# Patient Record
Sex: Female | Born: 1945 | Race: White | Hispanic: No | Marital: Married | Smoking: Former smoker
Health system: Southern US, Community
[De-identification: ages and names within clinical notes are randomized; demographics above are authoritative.]

## PROBLEM LIST (undated history)

## (undated) DIAGNOSIS — F329 Major depressive disorder, single episode, unspecified: Secondary | ICD-10-CM

## (undated) DIAGNOSIS — M810 Age-related osteoporosis without current pathological fracture: Secondary | ICD-10-CM

## (undated) DIAGNOSIS — F32A Depression, unspecified: Secondary | ICD-10-CM

## (undated) DIAGNOSIS — Z8701 Personal history of pneumonia (recurrent): Secondary | ICD-10-CM

## (undated) DIAGNOSIS — F419 Anxiety disorder, unspecified: Secondary | ICD-10-CM

## (undated) DIAGNOSIS — E785 Hyperlipidemia, unspecified: Secondary | ICD-10-CM

## (undated) DIAGNOSIS — I10 Essential (primary) hypertension: Secondary | ICD-10-CM

## (undated) HISTORY — PX: BACK SURGERY: SHX140

## (undated) HISTORY — PX: TOTAL HIP ARTHROPLASTY: SHX124

## (undated) HISTORY — PX: KNEE SURGERY: SHX244

---

## 2004-05-22 ENCOUNTER — Observation Stay (HOSPITAL_COMMUNITY): Admission: RE | Admit: 2004-05-22 | Discharge: 2004-05-23 | Payer: Self-pay | Admitting: Urology

## 2004-07-05 ENCOUNTER — Observation Stay (HOSPITAL_COMMUNITY): Admission: RE | Admit: 2004-07-05 | Discharge: 2004-07-06 | Payer: Self-pay | Admitting: Urology

## 2006-08-26 ENCOUNTER — Encounter: Admission: RE | Admit: 2006-08-26 | Discharge: 2006-08-26 | Payer: Self-pay | Admitting: Orthopedic Surgery

## 2006-09-20 ENCOUNTER — Inpatient Hospital Stay (HOSPITAL_COMMUNITY): Admission: RE | Admit: 2006-09-20 | Discharge: 2006-09-21 | Payer: Self-pay | Admitting: Orthopedic Surgery

## 2006-10-22 ENCOUNTER — Ambulatory Visit: Payer: Self-pay | Admitting: Vascular Surgery

## 2006-10-22 ENCOUNTER — Ambulatory Visit: Admission: RE | Admit: 2006-10-22 | Discharge: 2006-10-22 | Payer: Self-pay | Admitting: Orthopedic Surgery

## 2007-05-01 ENCOUNTER — Ambulatory Visit: Payer: Self-pay | Admitting: Vascular Surgery

## 2007-05-06 ENCOUNTER — Inpatient Hospital Stay (HOSPITAL_COMMUNITY): Admission: RE | Admit: 2007-05-06 | Discharge: 2007-05-10 | Payer: Self-pay | Admitting: Orthopedic Surgery

## 2007-06-01 IMAGING — CR DG CHEST 2V
2 series · 2 of 2 positions shown · non-contrast
Comparison: 05/19/04.

CLINICAL DATA: Preop for spinal stenosis.
 CHEST - 2 VIEW:

[w chest pa]
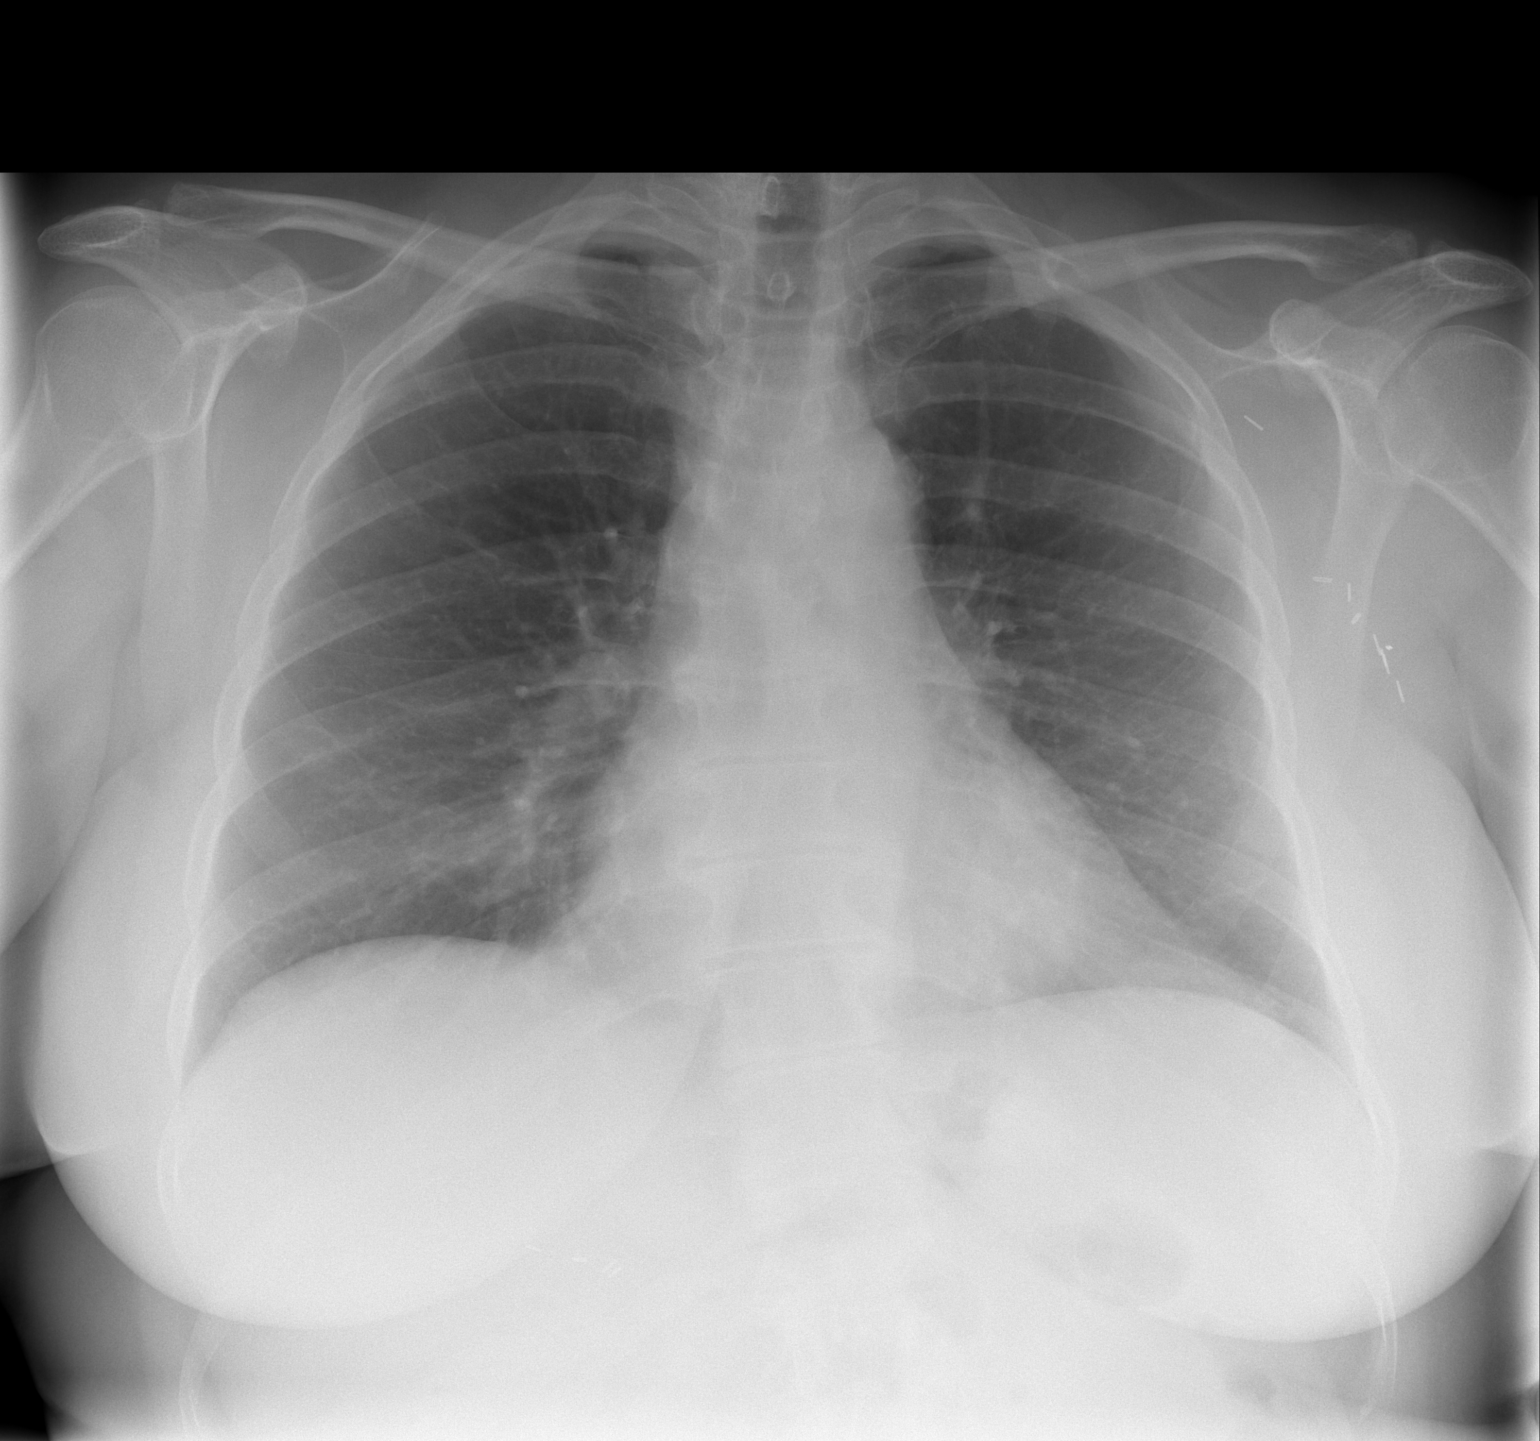

[w chest lat]
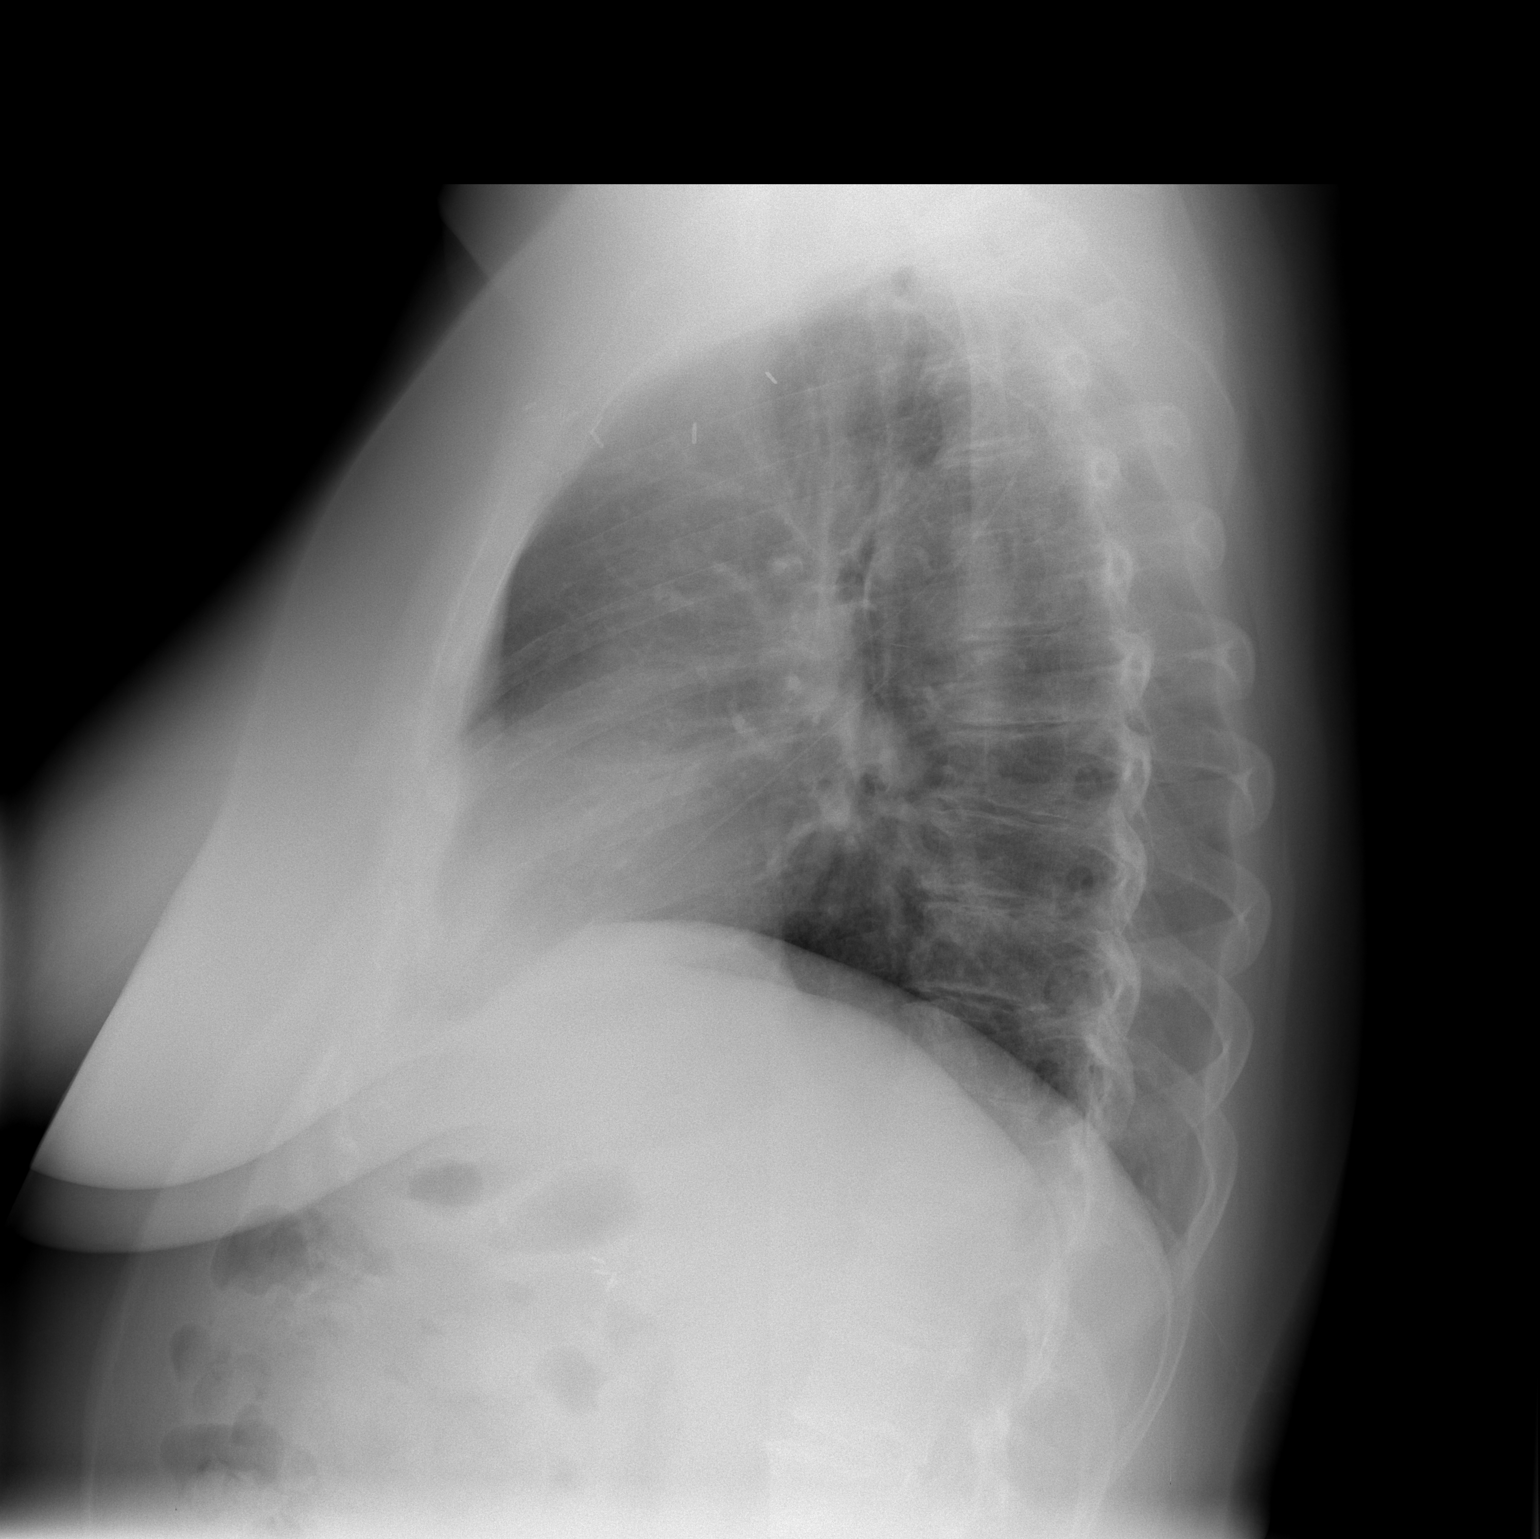

[2 of 2 positions shown; findings below may reference images not displayed]

FINDINGS: Heart and lungs normal.  There are surgical clips in the left axilla with surgical resection of the second rib.  No active process or bony lesions.
IMPRESSION: Postoperative changes as above ? no active disease.

## 2007-12-02 ENCOUNTER — Encounter: Admission: RE | Admit: 2007-12-02 | Discharge: 2007-12-02 | Payer: Self-pay | Admitting: Orthopedic Surgery

## 2008-08-13 IMAGING — US US EXTREM LOW VENOUS*L*
1 series · 14 of 24 positions shown · non-contrast
Comparison: None.

CLINICAL DATA: Left leg pain and swelling



[Series 1: us extrem low venous*left* · 14 of 29 slices shown]
[im 1/29]
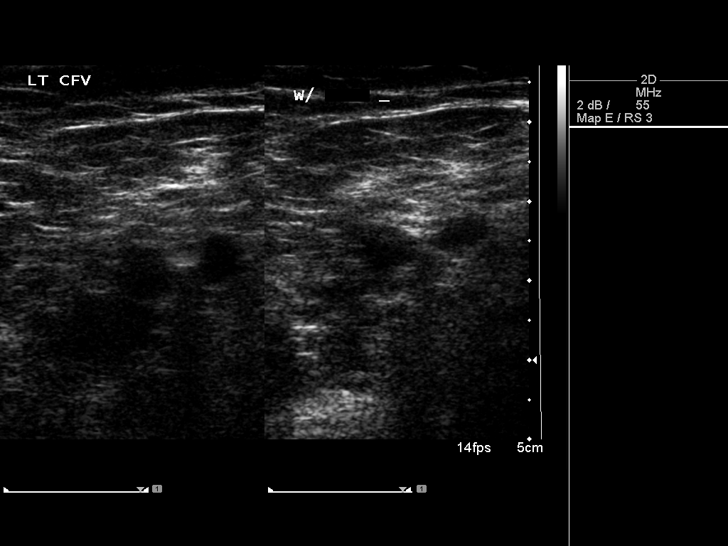
[im 3/29]
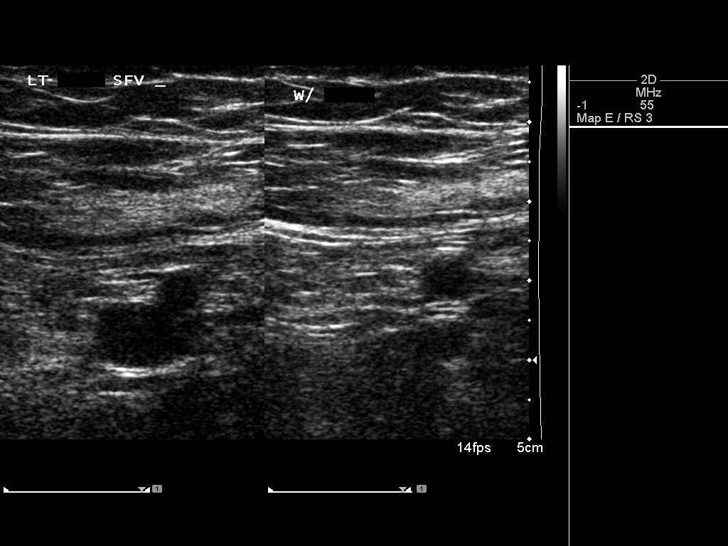
[im 5/29]
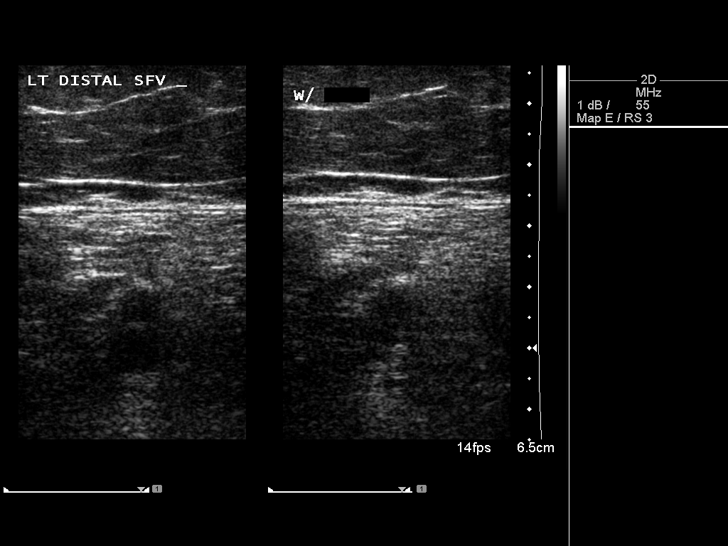
[im 8/29]
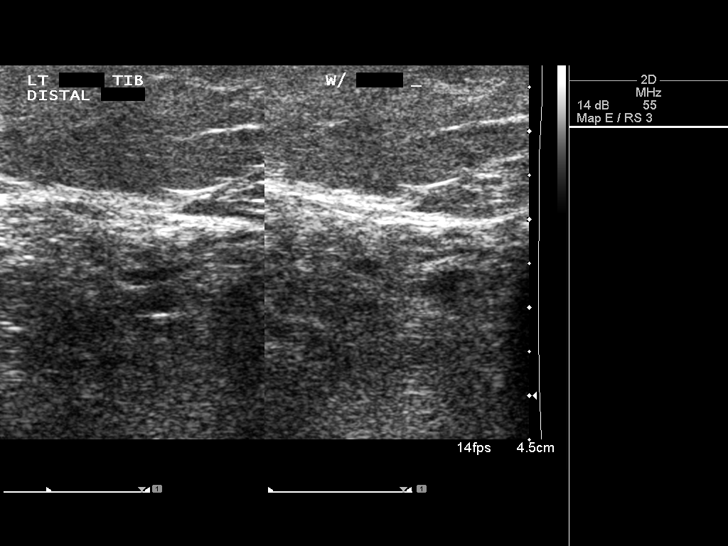
[im 9/29]
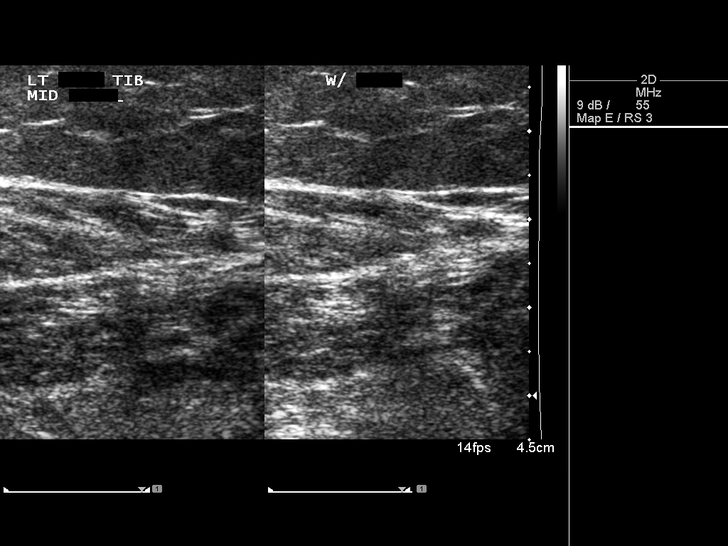
[im 11/29]
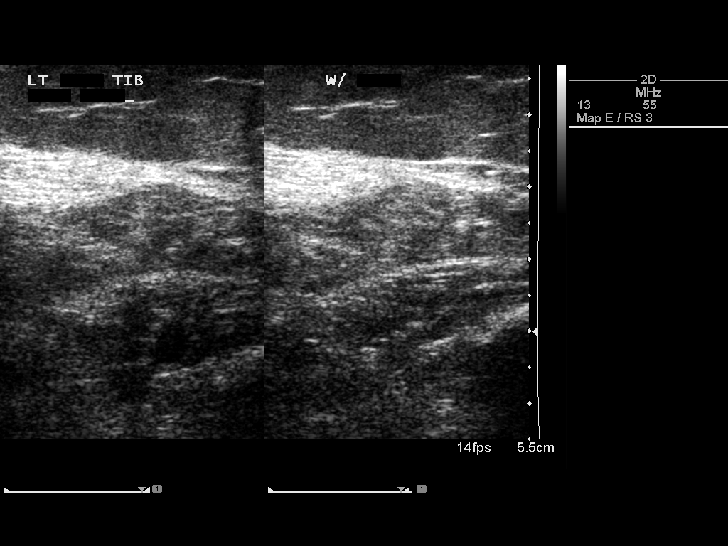
[im 14/29]
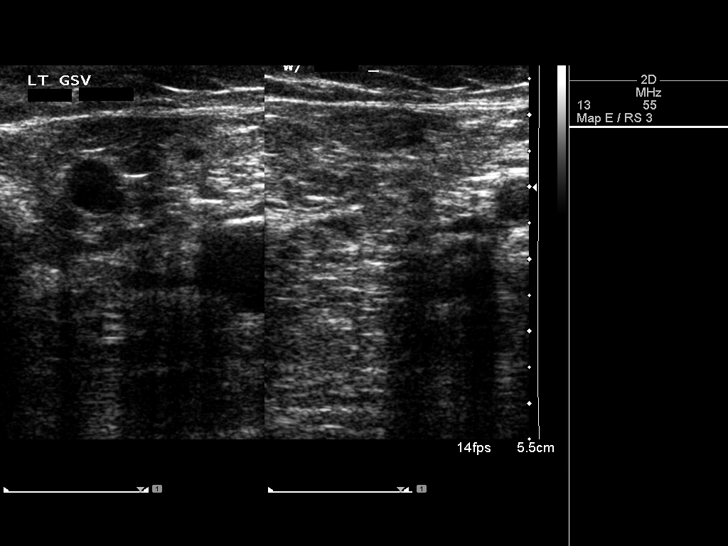
[im 15/29]
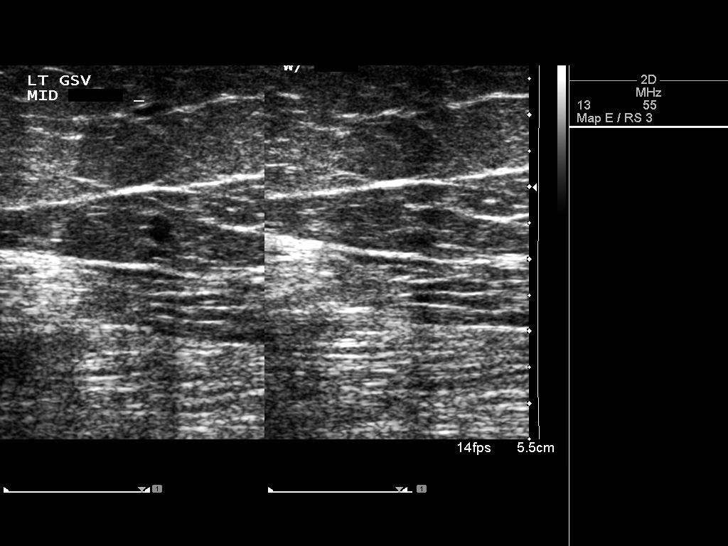
[im 18/29]
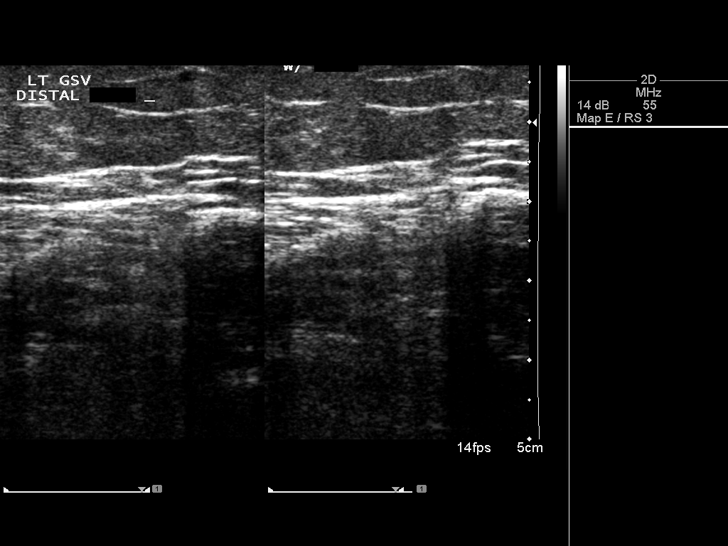
[im 20/29]
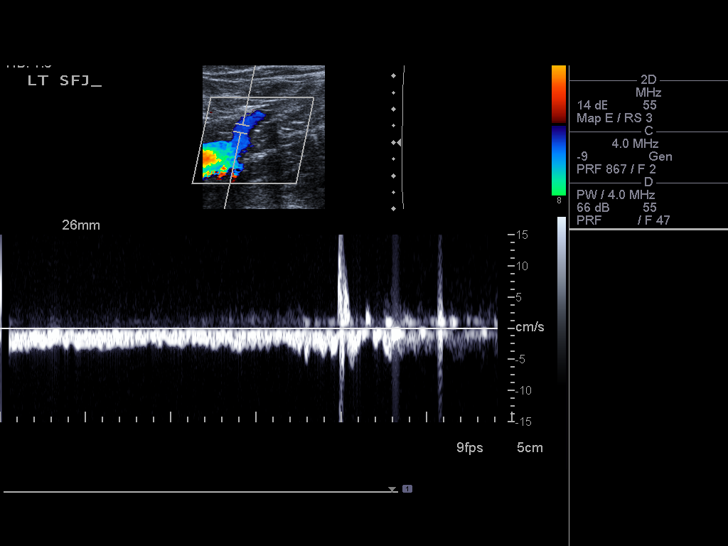
[im 22/29]
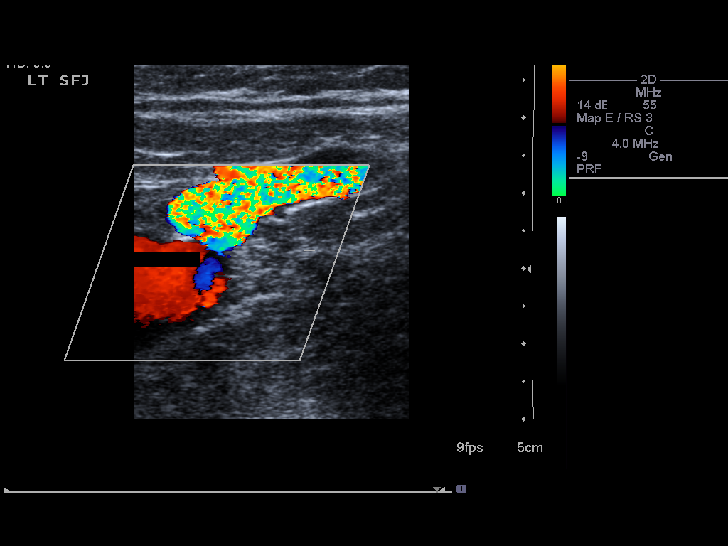
[im 24/29]
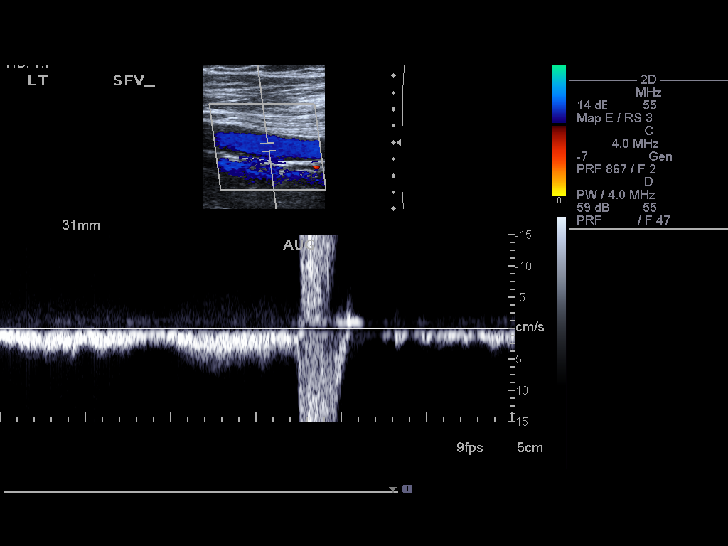
[im 26/29]
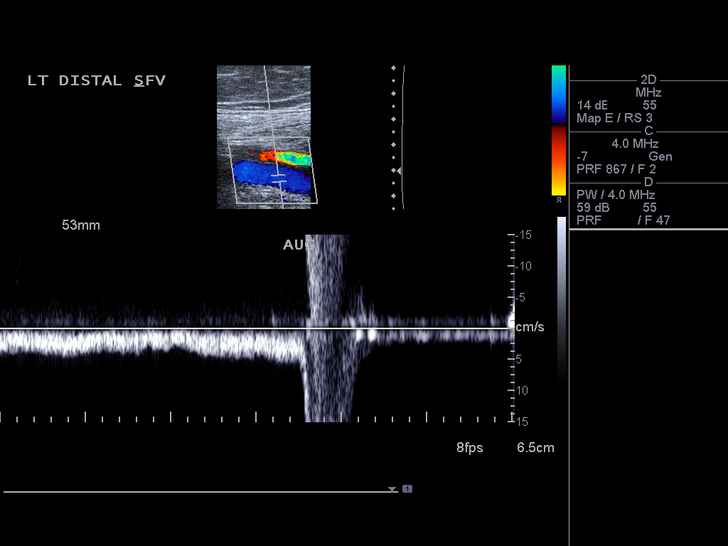
[im 29/29]
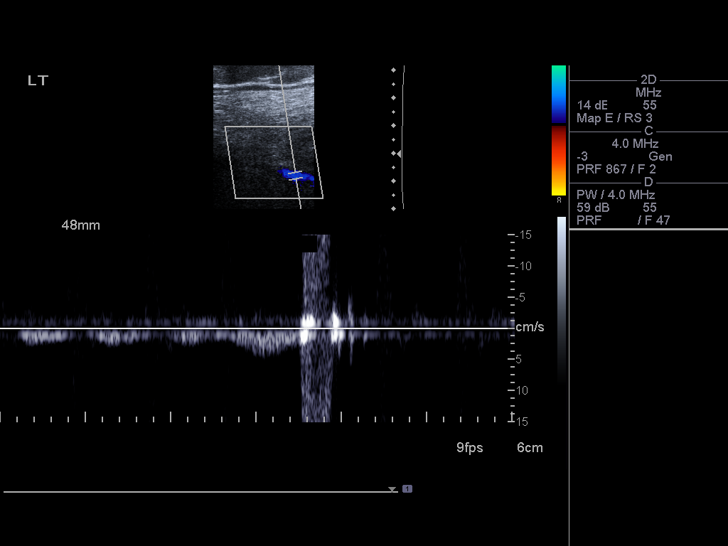

[14 of 24 positions shown; findings below may reference images not displayed]

FINDINGS: Normal compressibility of  the left common femoral,
superficial femoral, and popliteal veins is demonstrated, as well
as the visualized proximal calf veins.  No filling defects to
suggest DVT on grayscale or color Doppler imaging.  Doppler
waveforms show normal direction of venous flow, normal respiratory
phasicity and response to augmentation.
IMPRESSION: No evidence of  left lower extremity deep vein thrombosis.

## 2011-01-09 NOTE — H&P (Signed)
NAMECRISELDA, STARKE               ACCOUNT NO.:  [id number]   MEDICAL RECORD NO.:  [id number]          PATIENT TYPE:  INP   LOCATION:  NA                           FACILITY:  [facility]   PHYSICIAN:  Georges Lynch. Gioffre, M.D.DATE OF BIRTH:  1946/05/31   DATE OF ADMISSION:  05/06/2007  DATE OF DISCHARGE:                              HISTORY & PHYSICAL   CHIEF COMPLAINT:  Painful bilateral knees, left worse than right.   HISTORY OF PRESENT ILLNESS:  The patient is a 65 year old female with  chronic full body pain who has been having problems related to bilateral  knees.  She has been having injuries and pain with range of motion,  popping, clicking, arthroscopies performed.  She had large areas bone-on-  bone bilateral knees, left knee is significantly worse.  She has not had  significant difficulty with range of motion of pain in the knee.  The  patient has elected to proceed with a total knee arthroplasty.   ALLERGIES:  PENICILLIN.   CONFUSION WITH DILAUDID.   MEDICATIONS:  1. Prevacid 30 mg p.o. b.i.d.  2. Enalapril/hydrochlorothiazide 10/25 p.o. daily.  3. Cymbalta 60 mg a day.  4. Xanax 1 mg every 6 hours p.r.n.  5. Darvocet p.r.n.  6. Demerol p.r.n.   PAST MEDICAL HISTORY:  1. 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  2. Osteoarthritis bilateral knees.  3. Anxiety/depression.  4. Hypertension.  5. History of bleeding ulcers after cholecystectomy.  6. Reflux disease.  7. History of urinary tract infections.  8. Lumbar degenerative disk disease.   PRIMARY CARE PHYSICIAN:  Dr. Britt Bottom in [place].   PAST SURGICAL HISTORY:  1. Lumbar surgery in 2008.  2. Bilateral knee arthroscopies in 2008.  3. Cholecystectomy in 1992 with postoperative bleeding ulcer.  4. Multiple seizures removal of scar tissue.  5. Hysterectomy.   The patient admits the only complications of anesthesia with above-  mentioned surgical procedures was  she woke up during the procedure with  her cholecystectomy and had to be put back to sleep.   FAMILY MEDICAL HISTORY:  Mother is deceased from breast cancer.  Father  is alive with history of cardiac disease and prostate cancer.   SOCIAL HISTORY:  The patient is married.  Denies any alcohol or smoking  use.  Lives in a [place] house.   PHYSICAL EXAMINATION:  VITALS:  Height is 5 feet 7 inches, weight is  244.  Blood pressure is 122/82, pulse of 80, respirations 12, patient is  afebrile.  GENERAL:  This is a very sad and depressed appearing female.  She is  conscious, alert, well-developed.  Is transported around on a rolling  walker with her husband pushing her.  HEENT: Head was normocephalic.  Pupils equal, round and reactive.  Extraocular movements intact.  Gross hearing is intact.  NECK:  She has painful range of motion.  She is able to barely touch her  chin to her chest, look up vertical towards the ceiling.  She has about  a 45 degrees rotation right and left due  to arthritic problems.  She  denies any radicular complaints.  CHEST:  Lung sounds are clear and equal bilaterally, no wheezes, rales,  rhonchi.  HEART:  Regular rate and rhythm.  No murmurs, rubs or gallops.  ABDOMEN:  Soft.  Bowel sounds present.  EXTREMITIES:  Upper extremities had good range of motion of shoulders,  elbows and wrists.  LOWER EXTREMITIES:  Both hips had full extension.  She could flex them  up to about 100 degrees.  No pain with internal-external rotation.  Bilateral knees were boggy appearing, no signs of infection.  She very  gingerly and painfully.  Extension of her left knee is about 10 degrees  short of extension.  She can flex it back to about 80 degrees.  Right  knee she can nearly fully extend it, flex it back to about 90 degrees.  She is extremely painful in her left cap.  No pain rightly.  She has got  good motion of her ankles.   Peripheral vascular carotid pulses were 2+.  No  bruits.  Radial pulses  were 2+.  Dorsalis pedis pulses were 1+.  She had a slightly swollen  tight left calf, right was normal-appearing, soft.  No venostasis  changes or pigmentations in the lower extremities.  NEUROLOGICAL:  The patient was very down appearing but had no other  gross neurologic defects noted.  BREAST, RECTAL AND GU:  Exams were deferred.   IMPRESSION:  1. Severe osteoarthritis bilateral knees, left more symptomatic than      right.  2. Chronic generalized pain with new diagnosis of fibromyalgia.  3. History anxiety, depression.  4. Hypertension.  5. History of bleeding ulcers after cholecystectomy.  6. Reflux disease.  7. History urinary tract infections.  8. Degenerative disk disease in the lumbar and cervical region.   PLAN:  The patient is to have a left total knee arthroplasty by Dr.  Darrelyn Hillock at [facility] on May 06, 2007.  The patient will  have all routine labs and tests plus I will also had a venous Doppler  study of her left lower extremity to rule out DVT.  The patient had all  questions answered pertaining to this surgical procedure.  She would  prefer to use the morphine instead of Dilaudid due to previous confusion  issues.      Jamelle Rushing, P.A.    ______________________________  Georges Lynch Darrelyn Hillock, M.D.    RWK/MEDQ  D:  05/01/2007  T:  05/01/2007  Job:  [id number]

## 2011-01-09 NOTE — Op Note (Signed)
[name], [name]               ACCOUNT NO.:  [id number]   MEDICAL RECORD NO.:  [id number]          PATIENT TYPE:  INP   LOCATION:  0010                         FACILITY:  [facility]   PHYSICIAN:  Georges Lynch. Gioffre, M.D.DATE OF BIRTH:  02-06-1946   DATE OF PROCEDURE:  05/06/2007  DATE OF DISCHARGE:                               OPERATIVE REPORT   SURGEON:  Dr. Darrelyn Hillock.   OPERATIONS:  Arlyn Leak, PA   PREOPERATIVE DIAGNOSIS:  Severe degenerative arthritis left knee.   POSTOPERATIVE DIAGNOSIS:  Severe degenerative arthritis left knee.   OPERATION:  Left total knee arthroplasty.   Note, I utilized the DePuy total knee system.  All three components were  cemented.  I utilized the following sizes.  The femoral component was a  size 3 left posterior cruciate sacrificing type.  The tibial tray was a  size 3.  The tibial insert was a size 3, 12.5 mm thickness.  The patella  was a size 35 mm patella with three pegs.  Note I used one four-pronged  Mitek anchor to anchor the patellar tendon and I used vancomycin in the  cement.   PROCEDURE:  Under general anesthesia, routine orthopedic prep and  draping of the left lower extremity was carried out.  Leg was  exsanguinated with an Esmarch and tourniquet was elevated at 375 mmHg.  The knee was flexed.  An incision was made over the anterior aspect left  knee.  Bleeders identified and cauterized.  Two flaps were created in  the usual fashion.  Self-retaining retractors.  Self-retaining  retractors were inserted.  Following that I carried out a median  parapatellar incision and reflected the patella laterally.  I then  flexed the knee and did medial lateral meniscectomies.  I excised the  anterior and posterior cruciate ligaments.  Following that I did a  little release of the medial structures of the knee.  Following that,  initial drill hole was made in the intercondylar notch, #1 jig was  inserted and I removed 12 mm thickness off the  distal femur at that  time.  A #2 jig was inserted for measurement sake.  We measured the  femur to be a size 3.  We then inserted our #3 jig and carried out our  anterior, posterior and chamfer cuts for a size 3.  The appropriate cuts  were made for the femur.  Following that I prepared the tibia by  removing  4 mm thickness off the affected medial side of the tibia.  We  then cut our keel cut out of the tibia in the usual fashion.  We then  cut our notch cut out of distal femur.  Following that we utilized our  spacers inserted the self-retaining retractors and examined the  posterior condyles of the femur to make sure there were no large spurs.  At that time we then utilized our spacers and in flexion/extension we  had good stability.  Thoroughly water picked out the knee and then we  inserted our trial components and had excellent fit with 12 mm thickness  tibial insert.  We left the trials in temporarily and then cut our  patella, made the usual cuts for the resurfacing patella.  We measured  the patella to be a 35 patella.  Three drill holes were made in the  articular surface of the patella.  We then removed all trial components,  thoroughly water picked out the knee, dried the knee out inserted some  Gelfoam into the distal femur as well as down the proximal tibia with  the cement restrainer.  We then inserted our cement then inserted all  three components in simultaneously and after the cement was hard removed  all loose pieces cement.  We did a thorough inspection posteriorly as  well, made sure there no loose pieces of cement present.  We irrigated  the area out as well.  Note we did inject 30 mL of quarter percent  Marcaine with Toradol in the surrounding soft tissue structures.  We did  let the tourniquet down before we inserted our permanent tibial insert  to search for bleeders at that time.  We had good hemostasis.  We then  irrigated the knee out, flexed the knee and  inserted our size 3, 12 mm  thickness tibial insert.  It was a rotating platform.  We reduced the  knee had good flexion extension, good mediolateral stability.  We then  thoroughly water picked out the knee again, made sure there were no  loose fragments of cement.  We then closed the wound layers usual  fashion over a Hemovac drain.  Sterile dressings were applied.  The  patient had clindamycin 600 mg IV preop.  Postop she will be maintained  on clindamycin and heparin and Coumadin.           ______________________________  Georges Lynch. Darrelyn Hillock, M.D.     RAG/MEDQ  D:  05/06/2007  T:  05/07/2007  Job:  [id number]

## 2011-01-12 NOTE — Op Note (Signed)
[name], [name]               ACCOUNT NO.:  [id number]   MEDICAL RECORD NO.:  [id number]          PATIENT TYPE:  OBV   LOCATION:  1610                         FACILITY:  [facility]   PHYSICIAN:  Valetta Fuller, M.D.  DATE OF BIRTH:  18-Sep-1945   DATE OF PROCEDURE:  07/05/2004  DATE OF DISCHARGE:                                 OPERATIVE REPORT   PREOPERATIVE DIAGNOSIS:  Recurrent/persistent stress incontinence, status  post sling procedure for stress urinary incontinence.   POSTOPERATIVE DIAGNOSIS:  Recurrent/persistent stress incontinence, status  post sling procedure for stress urinary incontinence.   PROCEDURE PERFORMED:  Redo suburethral sling.   SURGEON:  Valetta Fuller, M.D.   ANESTHESIA:  General endotracheal.   INDICATIONS:  [name] is a 65 year old female.  She was recently  evaluated by myself because of severe voiding dysfunction and mixed urinary  incontinence.  She did certainly have symptoms of bladder over-activity with  frequency, urgency, and some urge incontinence.  She also had severe stress  incontinence, both subjectively as well as objectively.  She had a  significant cystocele with obvious urethral hypomobility.  Approximately six  weeks ago, the patient underwent an anterior repair and suburethral sling  via a SPARC procedure.  The patient recovered from surgery but continued to  have severe stress incontinence.  We evaluated her objectively and also  performed urodynamics, which confirmed the continued presence of urethral  hypomobility with a relatively low weak point pressure and questionable  component of type 3 incontinence as well.  The patient was interested in  having redo surgery to try to resolve this problem.  We had initially wanted  to wait an additional 4-6 weeks to allow for some increased recovery of her  tissue; however, she is out on disability and wanted to try to have redo  surgery at this time.  Again, she appears to understand  the advantages and  disadvantages of surgery, and there is no guarantee that her incontinence  will resolve.  She also appears to understand all of the risks, including  bleeding, erosion, over-correction, and possible urinary retention.  Full  informed consent was obtained.   TECHNIQUE/FINDINGS:  Patient was brought to the operating room where she had  successful induction of general endotracheal anesthesia.  She was placed in  the middle lithotomy position and prepped and draped in the usual manner.  Again, a Foley catheter was inserted, and her bladder was drained.  A  weighted vaginal speculum was utilized.  Her vaginal mucosa showed some  continued mild induration, consistent with some postoperative change.  There  is no evidence of erosion.  Her cystocele repair appeared to be holding up  well without significant anterior prolapse.  We injected the anterior mucosa  of the vagina, really in the distal urethra.  Her prior incision was made  more proximally to allow for repair of the cystocele.  We attempted to stay  more distal on this occasion.  The vaginal tissues were dissected free from  the urethra, allowing palpation laterally up to the obturator spaces.  Since  she had previously  had a retropubic suspension, we thought we would try a  transobturator and attempt placement of the sling more distally.  It appears  that her initial sling was closer to the bladder neck rather than the mid  urethra.  Once we had the mid urethra dissected free, we made two lateral  stab incisions about 4.5 to 5 cm lateral to the clitoris at that level.  Then using direct finger control, we were able to pass the transobturator  needles out lateral to the urethra.  Great care was taken to be sure that  the vaginal mucosa itself was not punctured during the passage.  The ends of  the slings were taken up one at a time through the obturator incision.  The  sling itself was positioned right at the mid  urethra underneath the right  angle clamp.  We did make the tension on the sling slightly tighter than  usual, given the questionable component of some type 3 incontinence.  The  redundant sling was then trimmed at the level of the incision.  The vaginal  mucosa was then reapproximated with 2-0 Vicryl sutures, and some vaginal  packing with bacitracin was utilized.  The catheter was left indwelling.  The patient appeared to tolerate the procedure well.  There were no obvious  complications.      DSG/MEDQ  D:  07/05/2004  T:  07/05/2004  Job:  [id number]

## 2011-01-12 NOTE — Op Note (Signed)
[name], [name]               ACCOUNT NO.:  [id number]   MEDICAL RECORD NO.:  [id number]          PATIENT TYPE:  OBV   LOCATION:  0347                         FACILITY:  [facility]   PHYSICIAN:  Valetta Fuller, M.D.  DATE OF BIRTH:  07-24-1946   DATE OF PROCEDURE:  05/22/2004  DATE OF DISCHARGE:                                 OPERATIVE REPORT   PREOPERATIVE DIAGNOSES:  1.  Grade 2 cystocele.  2.  Severe stress incontinence.   POSTOPERATIVE DIAGNOSES:  1.  Grade 2 cystocele.  2.  Severe stress incontinence.   PROCEDURE PERFORMED:  Anterior repair with [facility] retropubic suburethral  sling   SURGEON:  Valetta Fuller, M.D.   ANESTHESIA:  General.   INDICATIONS:  [name] is a 65 year old female, who was recently in to be  evaluated for mixed incontinence.  She has fairly severe stress as well as  urge-based leakage.  The stress incontinence was more problematic.  She wore  multiple heavy pads on a daily basis and had typical stress incontinence  with the usual stress events.  She had been on a low dose of antispasmodic  medication and was helping somewhat with her frequency and urgency.  At the  time of her evaluation, she did have a mild grade 2 cystocele with obvious  urethral hypermobility.  She had severe objective stress urinary  incontinence with a positive Marshall's test.  The patient underwent  extensive counseling with regard to treatment options and did elect to  proceed with repair of her cystocele and treatment of her stress  incontinence.  She appeared to understand the advantages and disadvantages  of the surgery as well as the potential complications that can occur.   TECHNIQUE AND FINDINGS:  The patient was brought to the operating room where  she had successful induction of general anesthesia.  She was placed in a mid  lithotomy position and prepped and draped in the usual manner.  She did  receive preoperative ciprofloxacin.  A weighted vaginal speculum  was  utilized, and a Foley catheter was inserted.  The anterior vaginal mucosa  was infiltrated with some Marcaine to aid in the dissection.  An incision  was then made from mid urethra out back to scarred vaginal cuff.  Her  anterior vaginal tissues were somewhat atrophic and attenuated.  The planes  otherwise dissected out very nicely, and we were able to completely free up  the cystocele.  This was then treated with reapproximation of the  perivesical fascia with some interrupted mattress sutures of 2-0 Vicryl  suture.  The cystocele reduced quite nicely.  We were able to palpate the  retropubic space bilaterally, although we did not actually perforate the  endopelvic fascia.  With the bladder decompressed, we made two small stab  incisions just lateral to the midline suprapubically.  The [facility] needles  were then passed with direct digital finger control bilaterally.  The Foley  catheter was then removed, and flexible cystoscopy was performed.  Blue dye  could be seen from both ureteral orifices.  The needles appeared to be right  at the bladder neck, and there was no evidence of any bladder injury or  perforation.  The sling was then grabbed with the [facility] needles and brought  out to the position of the mid urethra.  A right-angle clamps was passed  underneath the sling to create the appropriate tension.  The sleeve of the  sling was then removed, and the redundant sling was transected at the level  of the skin.  We were happy with positioning and tension of the sling.  A  moderate degree of vaginal redundant mucosa was then trimmed away.  The  vaginal incision was closed with a running 2-0 Vicryl suture.  A Foley  catheter had been reinserted.  Vaginal packing was utilized, although  bleeding was relatively minimal.  The suprapubic wounds will be closed with  Dermabond.  The Foley catheter was left indwelling.  The patient appeared to  tolerate the procedure well.  There were no  obvious complications.      DSG/MEDQ  D:  05/22/2004  T:  05/22/2004  Job:  [id number]

## 2011-01-12 NOTE — Op Note (Signed)
[name], [name]               ACCOUNT NO.:  [id number]   MEDICAL RECORD NO.:  [id number]          PATIENT TYPE:  AMB   LOCATION:  DAY                          FACILITY:  [facility]   PHYSICIAN:  Georges Lynch. Gioffre, M.D.DATE OF BIRTH:  Nov 27, 1945   DATE OF PROCEDURE:  09/19/2006  DATE OF DISCHARGE:                               OPERATIVE REPORT   SURGEON:  Georges Lynch. Darrelyn Hillock, M.D.   ASSISTANT:  Jene Every, MD   PREOPERATIVE DIAGNOSES:  1. Spinal stenosis at L2-L3 with large foraminal spurs.  2. Spinal stenosis at L5-S1.  3. Herniated disk L5-S1 on the right.  NOTE:  All her symptoms were on      the right.  She had occasional pain on the left.   POSTOPERATIVE DIAGNOSES:  1. Spinal stenosis at L2-L3 with large foraminal spurs.  2. Spinal stenosis at L5-S1.  3. Herniated disk L5-S1 on the right.  NOTE:  All her symptoms were on      the right.  She had occasional pain on the left.   OPERATION:  1. Central decompressive lumbar laminectomy at L2-L3.  2. Foraminotomies bilaterally at L2-L3.  3. Central decompression of L5-S1.  4. Foraminotomies at L5-S1 bilaterally.  5. Microdiskectomy at L5-S1 on the right.   PROCEDURE:  Under general anesthesia, routine orthopedic prep and  draping of the lower back carried out.  The patient had 600 mg of  clindamycin IV preop.  Sterile prep and drape of the back was carried  out.  Two needles were placed in back for localization purposes.  X-ray  was taken.  Once I identified the L2-L3 interspace, an incision then was  made there distally.  We literally exposed two levels L2-3, L5-S1.  Following that, the self-retaining retractors were inserted.  I then  separated the muscle from the lamina of L2-L3 and just distal to that as  well.  I then inserted the [facility] retractors.  Another x-ray was  taken with clamps in place.  I then removed the spinous process of L2, a  portion of L3, went down did a central decompression of L2-L3 until we  had a completely opened canal.  We then did foraminotomies bilaterally  and removed the ligamentum flavum as well.  We utilized the microscope  at this time.  We had now decompressed both lateral recesses as well.  Following the procedure, we were able to easily move about the dura and  the hockey-stick went easily down into the foramina bilaterally.  We  also we utilized a hockey-stick to probe proximally to make sure there  was no further stenosis.  There was not.  We then probed distally as  well.  There was no spread of stenosis.  No herniated disk.  The wound  was thoroughly irrigated.  We loosely applied some thrombin-soaked  Gelfoam.  Next the microscope was shifted distally, we went down and a  central decompression of L5-S1 in the usual fashion.  The ligamentum  flavum was quite thickened in this area, so great care was taken not to  injure the underlying dura.  We  literally peeled the ligamentum flavum  off the dura.  We then did bilateral foraminotomies at L5-S1.  We  exposed the L5-S1 disk on the right, made a cruciate incision and noted  the disk space was quite narrow.  We did do a microdiskectomy in the  usual fashion by making a cruciate incision of the posterior  longitudinal ligament.  We utilized the Epstein curettes and the nerve  hook to tease out any other fragments of disk.  We then utilized a  hockey-stick, went proximally distally and out into the foramina  bilaterally make sure there was no further stenosis.  The recesses were  decompressed quite nicely as well.  As I stated we used the microscope.  We thoroughly irrigated out the area.  Good hemostasis was maintained.  We loosely applied some thrombin-soaked Gelfoam.  I closed the wound  layers in the usual fashion except the deep proximal and distal  portions, small portion were left open for drainage purposes.  Remaining  part of the wound was closed in the usual fashion.  Skin was closed with  metal staples.   Sterile Neosporin dressing was applied.  The patient had  30 mg of Toradol IV prior to leaving the operating room.           ______________________________  Georges Lynch Darrelyn Hillock, M.D.     RAG/MEDQ  D:  09/19/2006  T:  09/19/2006  Job:  [id number]

## 2011-01-12 NOTE — Discharge Summary (Signed)
NAMESAMELLA, LUCCHETTI               ACCOUNT NO.:  [id number]   MEDICAL RECORD NO.:  [id number]          PATIENT TYPE:  INP   LOCATION:  1621                         FACILITY:  [facility]   PHYSICIAN:  Georges Lynch. Gioffre, M.D.DATE OF BIRTH:  [birth year]   DATE OF ADMISSION:  05/06/2007  DATE OF DISCHARGE:  05/10/2007                               DISCHARGE SUMMARY   ADMISSION DIAGNOSES:  1. Severe osteoarthritis bilateral knees, left more symptomatic than      right.  2. Chronic generalized pain with  new diagnosis of fibromyalgia.  3. History of anxiety/depression.  4. Hypertension.  5. History of bleeding ulcers after cholecystectomy.  6. Reflux disease.  7. History of urinary tract infections.  8. History of degenerative disk disease, lumbar cervical region.   DISCHARGE DIAGNOSES:  1. Left total knee arthroplasty.  2. Severe osteoarthritis, right knee.  3. Postoperative tachycardia, felt to be pain related, resolved.  4. Postoperative somnolence, narcotic-related, resolved.  5. Postoperative blood loss anemia, allowed to self-correct without      transfusion.  6. Chronic generalized pain, though diagnosed with fibromyalgia.  7. Anxiety/depression.  8. History of hypertension.  9. History of bleeding ulcers, after cholecystectomy.  10.Reflux disease.  11.Recurrent urinary tract infections.  12.Cervical and lumbar disk disease.   HISTORY OF PRESENT ILLNESS:  The patient is a 65 year old  female with  chronic whole body pain, who saw Dr. Darrelyn Hillock for bilateral knee pain.  These have been evaluated with MRIs, arthroscopy, was found to have  significant osteoarthritis.  The patient would like to proceed with a  left total knee arthroplasty, due to failed conservative treatment.   ALLERGIES:  PENICILLIN, CONFUSION WITH DILAUDID.   MEDICATIONS:  1. Prevacid 30 mg twice a day.  2. Enalapril/hydrochlorothiazide 10/25 mg a day.  3. Cymbalta 60 mg a day.  4. Xanax 1 mg q.6 hours.  5.  Darvocet p.r.n.  6. Demerol p.r.n.   SURGICAL PROCEDURES:  On May 06, 1999 the patient was taken to the  OR by Dr. Worthy Rancher, assisted by Oneida Alar, P.A.-C.  Under  general  anesthesia, the patient underwent left total knee arthroplasty with a  DePuy rotating platform system.  The patient tolerated procedure well,  there were no complications.  The patient had the following components  implanted: size 3 left femoral component, a size 3 keeled tibial tray,  size 35-mm 3-peg patella, size 3 12.5 polyethylene bearing.  All  components were implanted with  polymethylmethacrylate,  vancomycin  impregnation.  The patient also had her patellar tendon secured with a  Mitek anchor, prophylactic __________.   CONSULTS:  The following consults were requested:  Physical therapy,  occupational therapy, case manager.   HOSPITAL COURSE:  On May 06, 2007, the patient was taken to the  [facility], care of Dr. Worthy Rancher.  The patient was taken to  the OR where a left total knee arthroplasty was performed without any  complications.  The patient was transferred to the recovery room, then  to orthopedic floor with IV antibiotics, pain medicines, Coumadin and  heparin for DVT  prophylaxis following total knee protocol.  The patient  spent 4 days postoperative course in the hospital without any  significant untoward events.  The patient was able to transfer from IV  medications to p.o. meds well.  She was initially a little somnolent the  first postoperative day, but once we got her off the IV pain medicines,  she cleared up nicely.  She did also have a little bit of low blood  pressures initially, with a little bit of tachycardia.  EKG found to be  sinus tach which was felt to be related to dehydration, so she was  hydration was improved, and this did also improve without any further  untoward events.  The patient continued to work well with physical  therapy.  The patient's wound  remained benign for any signs of  infection.  Her leg remained neuromotor vascularly intact.  The patient  also had a mild case of hypokalemia, and this was allowed to correct  with p.o. supplements.  It was felt on post-op day #4, the patient was  orthopedically and medically stable, ready for discharge home, so  arrangements were made for outpatient home health physical therapy, and  she was discharged home in good condition with routine follow-up.   LABS:  CBC on admission found WBCs 9.6, hemoglobin 14.5, hematocrit  4.3.1 with 334 platelets.  On discharge, her hemoglobin dropped to  9.2/26.7.  The patient tolerated it well without any untoward events.  INR was 1.6 on admission.  Routine chemistry was found: Sodium 137,  potassium of 3.1, glucose 91, BUN 7, creatinine 4.92 on date of  discharge, with a potassium improved from 2.9 the day previous with some  oral potassium.  The patient's estimated GFR on admission was 55, but on  discharge it was greater than 60.  Preoperatively, she has rare bacteria  with trace leukocyte esterase and many epithelial cells, but she was  trended with routine antibiotics.   DISCHARGE INSTRUCTIONS:  1. Ambien CR 1 tablet at bedtime as needed.  2. Enalapril/hydrochlorothiazide 10/25 mg 1 tablet in the morning  3. Prevacid 30 mg once a day.  4. Cymbalta 60 mg once a day.  5. Alprazolam 1 mg twice a day as needed.  6. Mepergan fortis 1 or 2 tablets every 6 hours for pain if needed.  7. Robaxin 500 mg 1 tablet every 6 hours for muscle spasms if needed.  8. Coumadin 5 mg 1-1/2 tablets a day, unless changed by pharmacy.   DIET:  No restrictions.   ACTIVITY:  Up to walking with the assistance of a walker.   WOUND CARE:  The patient is to keep wound clean and dry and change  dressing daily.   FOLLOW UP:  The patient is to follow-up with Dr. Darrelyn Hillock in 10 to 14  days.  The patient is to call [phone] for that follow-up appointment.   The patient's  condition upon discharge at home is listed improved and  good.      Jamelle Rushing, P.A.    ______________________________  Georges Lynch Darrelyn Hillock, M.D.    RWK/MEDQ  D:  05/21/2007  T:  05/21/2007  Job:  [id number]   cc:   Windy Fast A. Darrelyn Hillock, M.D.  Fax: [phone]

## 2011-06-08 LAB — URINE MICROSCOPIC-ADD ON

## 2011-06-08 LAB — COMPREHENSIVE METABOLIC PANEL
BUN: 10
CO2: 28
Calcium: 10.3
Chloride: 100
GFR calc Af Amer: >60
GFR calc non Af Amer: 55 — ABNORMAL LOW
Glucose, Bld: 102 — ABNORMAL HIGH
Sodium: 140
Total Bilirubin: 0.9
Total Protein: 8

## 2011-06-08 LAB — HEMOGLOBIN AND HEMATOCRIT, BLOOD
HCT: 26 — ABNORMAL LOW
HCT: 26.7 — ABNORMAL LOW
HCT: 29 — ABNORMAL LOW
HCT: 30.6 — ABNORMAL LOW
Hemoglobin: 10 — ABNORMAL LOW
Hemoglobin: 11.8 — ABNORMAL LOW
Hemoglobin: 8.9 — ABNORMAL LOW
Hemoglobin: 9.2 — ABNORMAL LOW

## 2011-06-08 LAB — CBC
Hemoglobin: 14.5
Platelets: 334
RDW: 13.5

## 2011-06-08 LAB — BASIC METABOLIC PANEL
BUN: 7
CO2: 30
CO2: 32
Chloride: 101
Creatinine, Ser: 0.85
Creatinine, Ser: 0.92
GFR calc non Af Amer: >60
Glucose, Bld: 91
Potassium: 2.9 — ABNORMAL LOW
Potassium: 3.1 — ABNORMAL LOW

## 2011-06-08 LAB — DIFFERENTIAL
Basophils Relative: 0
Eosinophils Absolute: 0.2
Eosinophils Relative: 2
Lymphs Abs: 3
Monocytes Relative: 6

## 2011-06-08 LAB — POTASSIUM: Potassium: 4.3

## 2011-06-08 LAB — ABO/RH: ABO/RH(D): O POS

## 2011-06-08 LAB — URINALYSIS, ROUTINE W REFLEX MICROSCOPIC
Urobilinogen, UA: 0.2
pH: 7.5

## 2011-06-08 LAB — TYPE AND SCREEN
ABO/RH(D): O POS
Antibody Screen: NEGATIVE

## 2011-06-08 LAB — PROTIME-INR: Prothrombin Time: 18.5 — ABNORMAL HIGH

## 2015-09-28 ENCOUNTER — Encounter: Payer: Self-pay | Admitting: *Deleted

## 2015-09-28 ENCOUNTER — Emergency Department (INDEPENDENT_AMBULATORY_CARE_PROVIDER_SITE_OTHER)
Admission: EM | Admit: 2015-09-28 | Discharge: 2015-09-28 | Disposition: A | Discharge status: Home / Self Care | Payer: Medicare HMO | Source: Home / Self Care | Attending: Family Medicine | Admitting: Family Medicine

## 2015-09-28 DIAGNOSIS — I10 Essential (primary) hypertension: Secondary | ICD-10-CM | POA: Diagnosis not present

## 2015-09-28 DIAGNOSIS — R079 Chest pain, unspecified: Secondary | ICD-10-CM | POA: Diagnosis not present

## 2015-09-28 DIAGNOSIS — Z87891 Personal history of nicotine dependence: Secondary | ICD-10-CM | POA: Diagnosis not present

## 2015-09-28 DIAGNOSIS — G8929 Other chronic pain: Secondary | ICD-10-CM | POA: Diagnosis not present

## 2015-09-28 DIAGNOSIS — Z96641 Presence of right artificial hip joint: Secondary | ICD-10-CM | POA: Diagnosis not present

## 2015-09-28 DIAGNOSIS — R0602 Shortness of breath: Secondary | ICD-10-CM | POA: Diagnosis not present

## 2015-09-28 DIAGNOSIS — Z881 Allergy status to other antibiotic agents status: Secondary | ICD-10-CM | POA: Diagnosis not present

## 2015-09-28 DIAGNOSIS — R069 Unspecified abnormalities of breathing: Secondary | ICD-10-CM | POA: Diagnosis not present

## 2015-09-28 DIAGNOSIS — Z8673 Personal history of transient ischemic attack (TIA), and cerebral infarction without residual deficits: Secondary | ICD-10-CM | POA: Diagnosis not present

## 2015-09-28 DIAGNOSIS — R0600 Dyspnea, unspecified: Secondary | ICD-10-CM

## 2015-09-28 DIAGNOSIS — K219 Gastro-esophageal reflux disease without esophagitis: Secondary | ICD-10-CM | POA: Diagnosis not present

## 2015-09-28 DIAGNOSIS — Z96653 Presence of artificial knee joint, bilateral: Secondary | ICD-10-CM | POA: Diagnosis not present

## 2015-09-28 DIAGNOSIS — Z886 Allergy status to analgesic agent status: Secondary | ICD-10-CM | POA: Diagnosis not present

## 2015-09-28 HISTORY — DX: Depression, unspecified: F32.A

## 2015-09-28 HISTORY — DX: Hyperlipidemia, unspecified: E78.5

## 2015-09-28 HISTORY — DX: Essential (primary) hypertension: I10

## 2015-09-28 HISTORY — DX: Anxiety disorder, unspecified: F41.9

## 2015-09-28 HISTORY — DX: Age-related osteoporosis without current pathological fracture: M81.0

## 2015-09-28 HISTORY — DX: Major depressive disorder, single episode, unspecified: F32.9

## 2015-09-28 HISTORY — DX: Personal history of pneumonia (recurrent): Z87.01

## 2015-09-28 NOTE — ED Notes (Signed)
EMS arrived. Report given to EMS by Dr. Cathren Harsh. Clemens Catholic, LPN

## 2015-09-28 NOTE — ED Provider Notes (Signed)
CSN: [id number]     Arrival date & time 09/28/15  1135 History   First MD Initiated Contact with Patient 09/28/15 1137     Chief Complaint  Patient presents with  . Shortness of Breath    HPI Comments: Patient complains of persistent shortness of breath during the past month, worse during the past several days with development of cough.  She denies chest pain.  No fevers, chills, and sweats.  She complains of intermittent wheezing and tightness in her anterior chest. She has a history of total left knee replacement with revision, and right knee replacement in October 2016.  She states that she fell in August 2016 resulting in a hematoma in her left leg.  She has developed increasing pain in her left posterior knee during the past several days.  The history is provided by the patient.    Past Medical History  Diagnosis Date  . Hypertension   . Hyperlipidemia   . History of pneumonia   . Depression   . Anxiety   . Osteoporosis    Past Surgical History  Procedure Laterality Date  . Total hip arthroplasty    . Back surgery    . Knee surgery     History reviewed. No pertinent family history. Social History  Substance Use Topics  . Smoking status: Former Games developer  . Smokeless tobacco: None  . Alcohol Use: None   OB History    No data available     Review of Systems  Constitutional: Positive for activity change and fatigue. Negative for fever, chills and diaphoresis.  HENT: Negative.   Eyes: Negative.   Respiratory: Positive for cough, chest tightness, shortness of breath and wheezing. Negative for choking.   Cardiovascular: Negative.   Gastrointestinal: Negative.   Genitourinary: Negative.   Musculoskeletal: Positive for joint swelling and arthralgias.  Skin: Negative.   Neurological: Negative for headaches.    Allergies  Levaquin and Penicillins  Home Medications   Prior to Admission medications   Medication Sig Start Date End Date Taking? Authorizing Provider   albuterol (PROVENTIL HFA;VENTOLIN HFA) 108 (90 Base) MCG/ACT inhaler Inhale into the lungs every 6 (six) hours as needed for wheezing or shortness of breath.   Yes Historical Provider, MD  ALPRAZolam Prudy Feeler) 0.25 MG tablet Take 0.25 mg by mouth at bedtime as needed for anxiety.   Yes Historical Provider, MD  amLODipine-benazepril (LOTREL) 10-20 MG capsule Take 1 capsule by mouth daily.   Yes Historical Provider, MD  atenolol (TENORMIN) 100 MG tablet Take 100 mg by mouth daily.   Yes Historical Provider, MD  DULoxetine (CYMBALTA) 20 MG capsule Take 20 mg by mouth daily.   Yes Historical Provider, MD  enalapril (VASOTEC) 10 MG tablet Take 10 mg by mouth daily.   Yes Historical Provider, MD  metaxalone (SKELAXIN) 800 MG tablet Take 800 mg by mouth 3 (three) times daily.   Yes Historical Provider, MD  oxyCODONE-acetaminophen (PERCOCET/ROXICET) 5-325 MG tablet Take by mouth every 4 (four) hours as needed for severe pain.   Yes Historical Provider, MD  potassium chloride (KLOR-CON) 20 MEQ packet Take by mouth 2 (two) times daily.   Yes Historical Provider, MD  pravastatin (PRAVACHOL) 10 MG tablet Take 10 mg by mouth daily.   Yes Historical Provider, MD   Meds Ordered and Administered this Visit  Medications - No data to display  BP 127/78 mmHg  Pulse 83  Temp(Src) 98.3 F (36.8 C) (Oral)  Resp 30  Ht  (1.676  m)  Wt 255 lb (115.667 kg)  BMI 41.18 kg/m2  SpO2 98% No data found.   Physical Exam Nursing notes and Vital Signs reviewed. Appearance:  Patient appears stated age, and anxious but in no acute distress Eyes:  Pupils are equal, round, and reactive to light and accomodation.  Extraocular movement is intact.  Conjunctivae are not inflamed  Pharynx:  Normal; moist mucous membranes  Neck:  Supple.  No adenopathy Lungs:  Clear to auscultation.  Breath sounds are equal.  Moving air well.  No respiratory distress Heart:  Regular rate and rhythm without murmurs, rubs, or gallops.   Abdomen:  Nontender without masses or hepatosplenomegaly.  Bowel sounds are present.  No CVA or flank tenderness.  Extremities:  Trace lower leg edema.  Left lower leg has swelling and tenderness to palpation in popliteal fossa extending to left posterior calf Skin:  No rash present.   ED Course  Procedures none    Labs Reviewed -   EKG: Rate:  81 BPM PR:  156 msec QT:  374 msec QTcH:  410 msec QRSD:  90 msec QRS axis:  -11 degrees Interpretation:   Low voltage; sinus rhythm    MDM   1. Dyspnea; concern for ?PE    Patient transferred to [facility] ER by rescue squad.  Vital signs stable    Lattie Haw, MD 09/30/15 1329

## 2015-09-28 NOTE — ED Notes (Signed)
Notified pts husband, Jay Schlichter, per her request  that she was being transported to the ED. Clemens Catholic, LPN

## 2015-09-28 NOTE — ED Notes (Signed)
Pt c/o SOB x 1 mth, worse x 1 week. She reports having surgery on her RT leg in 10/16' with pain in her LT calf since after surgery. Denies swelling or pain in her calf. She recently stopped taking Oxycodone and started taking a muscle relaxer.

## 2015-09-30 ENCOUNTER — Telehealth: Payer: Self-pay | Admitting: Emergency Medicine

## 2015-09-30 NOTE — Discharge Instructions (Signed)

## 2015-10-07 DIAGNOSIS — R0989 Other specified symptoms and signs involving the circulatory and respiratory systems: Secondary | ICD-10-CM | POA: Diagnosis not present

## 2015-10-07 DIAGNOSIS — M79602 Pain in left arm: Secondary | ICD-10-CM | POA: Diagnosis not present

## 2015-10-07 DIAGNOSIS — R079 Chest pain, unspecified: Secondary | ICD-10-CM | POA: Diagnosis not present

## 2015-10-07 DIAGNOSIS — M79609 Pain in unspecified limb: Secondary | ICD-10-CM | POA: Diagnosis not present

## 2015-10-07 DIAGNOSIS — M79605 Pain in left leg: Secondary | ICD-10-CM | POA: Diagnosis not present

## 2015-10-07 DIAGNOSIS — R0602 Shortness of breath: Secondary | ICD-10-CM | POA: Diagnosis not present

## 2015-10-07 DIAGNOSIS — Z96641 Presence of right artificial hip joint: Secondary | ICD-10-CM | POA: Diagnosis not present

## 2015-10-07 DIAGNOSIS — R062 Wheezing: Secondary | ICD-10-CM | POA: Diagnosis not present

## 2015-10-07 DIAGNOSIS — K219 Gastro-esophageal reflux disease without esophagitis: Secondary | ICD-10-CM | POA: Diagnosis not present

## 2015-10-07 DIAGNOSIS — Z96653 Presence of artificial knee joint, bilateral: Secondary | ICD-10-CM | POA: Diagnosis not present

## 2015-10-07 DIAGNOSIS — Z8673 Personal history of transient ischemic attack (TIA), and cerebral infarction without residual deficits: Secondary | ICD-10-CM | POA: Diagnosis not present

## 2015-10-07 DIAGNOSIS — Z87891 Personal history of nicotine dependence: Secondary | ICD-10-CM | POA: Diagnosis not present

## 2015-10-07 DIAGNOSIS — I10 Essential (primary) hypertension: Secondary | ICD-10-CM | POA: Diagnosis not present

## 2015-10-07 DIAGNOSIS — R072 Precordial pain: Secondary | ICD-10-CM | POA: Diagnosis not present

## 2015-12-01 DIAGNOSIS — M542 Cervicalgia: Secondary | ICD-10-CM | POA: Diagnosis not present

## 2015-12-01 DIAGNOSIS — M5417 Radiculopathy, lumbosacral region: Secondary | ICD-10-CM | POA: Diagnosis not present

## 2015-12-01 DIAGNOSIS — M5481 Occipital neuralgia: Secondary | ICD-10-CM | POA: Diagnosis not present

## 2015-12-01 DIAGNOSIS — R69 Illness, unspecified: Secondary | ICD-10-CM | POA: Diagnosis not present

## 2015-12-01 DIAGNOSIS — M5412 Radiculopathy, cervical region: Secondary | ICD-10-CM | POA: Diagnosis not present

## 2016-03-26 DIAGNOSIS — H2513 Age-related nuclear cataract, bilateral: Secondary | ICD-10-CM | POA: Diagnosis not present

## 2016-03-26 DIAGNOSIS — H524 Presbyopia: Secondary | ICD-10-CM | POA: Diagnosis not present

## 2016-05-15 DIAGNOSIS — H40013 Open angle with borderline findings, low risk, bilateral: Secondary | ICD-10-CM | POA: Diagnosis not present

## 2016-06-19 DIAGNOSIS — H2513 Age-related nuclear cataract, bilateral: Secondary | ICD-10-CM | POA: Diagnosis not present

## 2016-06-19 DIAGNOSIS — H25013 Cortical age-related cataract, bilateral: Secondary | ICD-10-CM | POA: Diagnosis not present

## 2016-06-19 DIAGNOSIS — H2512 Age-related nuclear cataract, left eye: Secondary | ICD-10-CM | POA: Diagnosis not present

## 2016-06-19 DIAGNOSIS — H2511 Age-related nuclear cataract, right eye: Secondary | ICD-10-CM | POA: Diagnosis not present

## 2016-06-19 DIAGNOSIS — H40003 Preglaucoma, unspecified, bilateral: Secondary | ICD-10-CM | POA: Diagnosis not present

## 2016-07-24 DIAGNOSIS — N644 Mastodynia: Secondary | ICD-10-CM | POA: Diagnosis not present

## 2016-07-30 DIAGNOSIS — Z7982 Long term (current) use of aspirin: Secondary | ICD-10-CM | POA: Diagnosis not present

## 2016-07-30 DIAGNOSIS — I10 Essential (primary) hypertension: Secondary | ICD-10-CM | POA: Diagnosis not present

## 2016-07-30 DIAGNOSIS — E7800 Pure hypercholesterolemia, unspecified: Secondary | ICD-10-CM | POA: Diagnosis not present

## 2016-07-30 DIAGNOSIS — M1990 Unspecified osteoarthritis, unspecified site: Secondary | ICD-10-CM | POA: Diagnosis not present

## 2016-07-30 DIAGNOSIS — H25811 Combined forms of age-related cataract, right eye: Secondary | ICD-10-CM | POA: Diagnosis not present

## 2016-07-30 DIAGNOSIS — Z87891 Personal history of nicotine dependence: Secondary | ICD-10-CM | POA: Diagnosis not present

## 2016-07-30 DIAGNOSIS — H25013 Cortical age-related cataract, bilateral: Secondary | ICD-10-CM | POA: Diagnosis not present

## 2016-07-30 DIAGNOSIS — K219 Gastro-esophageal reflux disease without esophagitis: Secondary | ICD-10-CM | POA: Diagnosis not present

## 2016-07-30 DIAGNOSIS — E1136 Type 2 diabetes mellitus with diabetic cataract: Secondary | ICD-10-CM | POA: Diagnosis not present

## 2016-07-30 DIAGNOSIS — Z8673 Personal history of transient ischemic attack (TIA), and cerebral infarction without residual deficits: Secondary | ICD-10-CM | POA: Diagnosis not present

## 2016-07-30 DIAGNOSIS — M5136 Other intervertebral disc degeneration, lumbar region: Secondary | ICD-10-CM | POA: Diagnosis not present

## 2016-07-30 DIAGNOSIS — H2511 Age-related nuclear cataract, right eye: Secondary | ICD-10-CM | POA: Diagnosis not present

## 2016-07-30 DIAGNOSIS — H5203 Hypermetropia, bilateral: Secondary | ICD-10-CM | POA: Diagnosis not present

## 2016-09-19 DIAGNOSIS — H25812 Combined forms of age-related cataract, left eye: Secondary | ICD-10-CM | POA: Diagnosis not present

## 2016-09-19 DIAGNOSIS — Z8673 Personal history of transient ischemic attack (TIA), and cerebral infarction without residual deficits: Secondary | ICD-10-CM | POA: Diagnosis not present

## 2016-09-19 DIAGNOSIS — Z87891 Personal history of nicotine dependence: Secondary | ICD-10-CM | POA: Diagnosis not present

## 2016-09-19 DIAGNOSIS — E119 Type 2 diabetes mellitus without complications: Secondary | ICD-10-CM | POA: Diagnosis not present

## 2016-09-19 DIAGNOSIS — I10 Essential (primary) hypertension: Secondary | ICD-10-CM | POA: Diagnosis not present

## 2016-09-19 DIAGNOSIS — H2512 Age-related nuclear cataract, left eye: Secondary | ICD-10-CM | POA: Diagnosis not present

## 2016-11-07 DIAGNOSIS — M797 Fibromyalgia: Secondary | ICD-10-CM | POA: Diagnosis not present

## 2016-11-07 DIAGNOSIS — M5412 Radiculopathy, cervical region: Secondary | ICD-10-CM | POA: Diagnosis not present

## 2016-11-07 DIAGNOSIS — M5481 Occipital neuralgia: Secondary | ICD-10-CM | POA: Diagnosis not present

## 2016-11-07 DIAGNOSIS — M5417 Radiculopathy, lumbosacral region: Secondary | ICD-10-CM | POA: Diagnosis not present

## 2016-12-19 DIAGNOSIS — I10 Essential (primary) hypertension: Secondary | ICD-10-CM | POA: Diagnosis not present

## 2016-12-19 DIAGNOSIS — F339 Major depressive disorder, recurrent, unspecified: Secondary | ICD-10-CM | POA: Diagnosis not present

## 2016-12-19 DIAGNOSIS — K219 Gastro-esophageal reflux disease without esophagitis: Secondary | ICD-10-CM | POA: Diagnosis not present

## 2016-12-19 DIAGNOSIS — M1611 Unilateral primary osteoarthritis, right hip: Secondary | ICD-10-CM | POA: Diagnosis not present

## 2016-12-19 DIAGNOSIS — E876 Hypokalemia: Secondary | ICD-10-CM | POA: Diagnosis not present

## 2016-12-19 DIAGNOSIS — R69 Illness, unspecified: Secondary | ICD-10-CM | POA: Diagnosis not present

## 2016-12-19 DIAGNOSIS — Z6841 Body Mass Index (BMI) 40.0 and over, adult: Secondary | ICD-10-CM | POA: Diagnosis not present

## 2016-12-19 DIAGNOSIS — M1990 Unspecified osteoarthritis, unspecified site: Secondary | ICD-10-CM | POA: Diagnosis not present

## 2016-12-19 DIAGNOSIS — Z8673 Personal history of transient ischemic attack (TIA), and cerebral infarction without residual deficits: Secondary | ICD-10-CM | POA: Diagnosis not present

## 2016-12-27 DIAGNOSIS — H40013 Open angle with borderline findings, low risk, bilateral: Secondary | ICD-10-CM | POA: Diagnosis not present

## 2017-04-04 DIAGNOSIS — I10 Essential (primary) hypertension: Secondary | ICD-10-CM | POA: Diagnosis not present

## 2017-04-04 DIAGNOSIS — R69 Illness, unspecified: Secondary | ICD-10-CM | POA: Diagnosis not present

## 2017-04-04 DIAGNOSIS — Z8673 Personal history of transient ischemic attack (TIA), and cerebral infarction without residual deficits: Secondary | ICD-10-CM | POA: Diagnosis not present

## 2017-04-04 DIAGNOSIS — F339 Major depressive disorder, recurrent, unspecified: Secondary | ICD-10-CM | POA: Diagnosis not present

## 2017-04-04 DIAGNOSIS — R110 Nausea: Secondary | ICD-10-CM | POA: Diagnosis not present

## 2017-04-04 DIAGNOSIS — M1990 Unspecified osteoarthritis, unspecified site: Secondary | ICD-10-CM | POA: Diagnosis not present

## 2017-04-04 DIAGNOSIS — M1611 Unilateral primary osteoarthritis, right hip: Secondary | ICD-10-CM | POA: Diagnosis not present

## 2017-06-05 DIAGNOSIS — M5481 Occipital neuralgia: Secondary | ICD-10-CM | POA: Diagnosis not present

## 2017-06-05 DIAGNOSIS — M5412 Radiculopathy, cervical region: Secondary | ICD-10-CM | POA: Diagnosis not present

## 2017-06-05 DIAGNOSIS — R69 Illness, unspecified: Secondary | ICD-10-CM | POA: Diagnosis not present

## 2017-07-03 DIAGNOSIS — M5481 Occipital neuralgia: Secondary | ICD-10-CM | POA: Diagnosis not present

## 2017-07-03 DIAGNOSIS — R410 Disorientation, unspecified: Secondary | ICD-10-CM | POA: Diagnosis not present

## 2017-07-03 DIAGNOSIS — M542 Cervicalgia: Secondary | ICD-10-CM | POA: Diagnosis not present

## 2017-07-03 DIAGNOSIS — M5417 Radiculopathy, lumbosacral region: Secondary | ICD-10-CM | POA: Diagnosis not present

## 2017-07-03 DIAGNOSIS — M5412 Radiculopathy, cervical region: Secondary | ICD-10-CM | POA: Diagnosis not present

## 2017-07-05 DIAGNOSIS — R930 Abnormal findings on diagnostic imaging of skull and head, not elsewhere classified: Secondary | ICD-10-CM | POA: Diagnosis not present

## 2017-07-05 DIAGNOSIS — I6782 Cerebral ischemia: Secondary | ICD-10-CM | POA: Diagnosis not present

## 2017-07-05 DIAGNOSIS — Z23 Encounter for immunization: Secondary | ICD-10-CM | POA: Diagnosis not present

## 2017-07-05 DIAGNOSIS — R9082 White matter disease, unspecified: Secondary | ICD-10-CM | POA: Diagnosis not present

## 2017-07-05 DIAGNOSIS — Z8673 Personal history of transient ischemic attack (TIA), and cerebral infarction without residual deficits: Secondary | ICD-10-CM | POA: Diagnosis not present

## 2017-07-05 DIAGNOSIS — R42 Dizziness and giddiness: Secondary | ICD-10-CM | POA: Diagnosis not present

## 2017-07-09 DIAGNOSIS — M5412 Radiculopathy, cervical region: Secondary | ICD-10-CM | POA: Diagnosis not present

## 2017-07-09 DIAGNOSIS — R270 Ataxia, unspecified: Secondary | ICD-10-CM | POA: Diagnosis not present

## 2017-07-09 DIAGNOSIS — M25562 Pain in left knee: Secondary | ICD-10-CM | POA: Diagnosis not present

## 2017-07-09 DIAGNOSIS — M5417 Radiculopathy, lumbosacral region: Secondary | ICD-10-CM | POA: Diagnosis not present

## 2017-07-09 DIAGNOSIS — M25561 Pain in right knee: Secondary | ICD-10-CM | POA: Diagnosis not present

## 2017-07-09 DIAGNOSIS — G5603 Carpal tunnel syndrome, bilateral upper limbs: Secondary | ICD-10-CM | POA: Diagnosis not present

## 2017-07-09 DIAGNOSIS — M797 Fibromyalgia: Secondary | ICD-10-CM | POA: Diagnosis not present

## 2019-12-24 ENCOUNTER — Ambulatory Visit: Payer: Medicare HMO | Admitting: Family Medicine

## 2020-01-27 ENCOUNTER — Ambulatory Visit: Payer: Medicare HMO | Admitting: Family Medicine

## 2020-03-09 ENCOUNTER — Ambulatory Visit: Payer: Medicare HMO | Admitting: Family Medicine

## 2020-03-09 DIAGNOSIS — Z0289 Encounter for other administrative examinations: Secondary | ICD-10-CM

## 2020-03-25 ENCOUNTER — Telehealth: Payer: Self-pay | Admitting: Family Medicine

## 2020-03-25 ENCOUNTER — Encounter: Payer: Self-pay | Admitting: Family Medicine

## 2020-03-25 NOTE — Telephone Encounter (Signed)
Pt was no show for appt 03/09/20. 2nd occurrence as a new patient appt. I have marked chart not to reschedule with you for new patient appt. Letter mailed.

## 2020-03-25 NOTE — Telephone Encounter (Signed)
Thank you :)
